# Patient Record
Sex: Male | Born: 1995 | Race: Black or African American | Hispanic: No | Marital: Single | Smoking: Never smoker
Health system: Southern US, Community
[De-identification: ages and names within clinical notes are randomized; demographics above are authoritative.]

## PROBLEM LIST (undated history)

## (undated) DIAGNOSIS — J45909 Unspecified asthma, uncomplicated: Secondary | ICD-10-CM

## (undated) DIAGNOSIS — J302 Other seasonal allergic rhinitis: Secondary | ICD-10-CM

---
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
## 2016-11-30 ENCOUNTER — Encounter (HOSPITAL_COMMUNITY): Payer: Self-pay | Admitting: Emergency Medicine

## 2016-11-30 ENCOUNTER — Emergency Department (HOSPITAL_COMMUNITY)
Admission: EM | Admit: 2016-11-30 | Discharge: 2016-11-30 | Disposition: A | Discharge status: Home / Self Care | Payer: Medicaid Other | Attending: Emergency Medicine | Admitting: Emergency Medicine

## 2016-11-30 DIAGNOSIS — Y999 Unspecified external cause status: Secondary | ICD-10-CM | POA: Insufficient documentation

## 2016-11-30 DIAGNOSIS — Z79899 Other long term (current) drug therapy: Secondary | ICD-10-CM | POA: Insufficient documentation

## 2016-11-30 DIAGNOSIS — W260XXA Contact with knife, initial encounter: Secondary | ICD-10-CM | POA: Insufficient documentation

## 2016-11-30 DIAGNOSIS — Y929 Unspecified place or not applicable: Secondary | ICD-10-CM | POA: Insufficient documentation

## 2016-11-30 DIAGNOSIS — S61412A Laceration without foreign body of left hand, initial encounter: Secondary | ICD-10-CM | POA: Insufficient documentation

## 2016-11-30 DIAGNOSIS — Y939 Activity, unspecified: Secondary | ICD-10-CM | POA: Insufficient documentation

## 2016-11-30 MED ORDER — TETANUS-DIPHTH-ACELL PERTUSSIS 5-2.5-18.5 LF-MCG/0.5 IM SUSP
0.5000 mL | Freq: Once | INTRAMUSCULAR | Status: AC
  Administered 2016-11-30: 0.5 mL via INTRAMUSCULAR
  Filled 2016-11-30: qty 0.5

## 2016-11-30 NOTE — Discharge Instructions (Signed)
Keep laceration clean and dry. Follow up with family doctor as needed. Tissue adhesive will fall off on its own in 3-5 days. Avoid picking at it or soaking hand

## 2016-11-30 NOTE — ED Triage Notes (Signed)
Per EMS pt complaint of pain to left palm related to knife cutting him while watching dishes; event 45 minutes ago.

## 2016-11-30 NOTE — ED Notes (Signed)
Bed: WTR5 Expected date:  Expected time:  Means of arrival:  Comments: 

## 2016-11-30 NOTE — ED Provider Notes (Signed)
[facility]-EMERGENCY DEPT Provider Note   CSN: [id number] Arrival date & time: 11/30/16  0906     History   Chief Complaint Chief Complaint  Patient presents with  . Laceration    HPI Jeffrey Phelps is a 21 y.o. male.  HPI Jeffrey Phelps is a 21 y.o. male presents to emergency department complaining of laceration to the left hand. Patient states he was washing dishes and states the knife slipped and cut his hand. He sustained 2 lacerations to the palm. States unable to stop the bleeding which is what brought him to the emergency department. Denies any numbness or weakness to the fingers distally. Applied pressure to stop the bleeding.   Past Medical History:  Diagnosis Date  . Asthma   . Seasonal allergies     There are no active problems to display for this patient.   History reviewed. No pertinent surgical history.     Home Medications    Prior to Admission medications   Medication Sig Start Date End Date Taking? Authorizing Provider  albuterol (PROVENTIL HFA;VENTOLIN HFA) 108 (90 BASE) MCG/ACT inhaler Inhale 2 puffs into the lungs every 6 (six) hours as needed for wheezing or shortness of breath.    [provider]  albuterol (PROVENTIL) (2.5 MG/3ML) 0.083% nebulizer solution Take 2.5 mg by nebulization every 6 (six) hours as needed for wheezing or shortness of breath.    [provider]  predniSONE (DELTASONE) 20 MG tablet Take 3 tablets (60 mg total) by mouth daily. 04/13/14   Santiago GladLaisure, Heather, PA-C    Family History No family history on file.  Social History Social History  Substance Use Topics  . Smoking status: Never Smoker  . Smokeless tobacco: Not on file  . Alcohol use Not on file     Allergies   Patient has no known allergies.   Review of Systems Review of Systems  Constitutional: Negative for chills and fever.  Skin: Positive for wound.  Neurological: Negative for weakness and numbness.  All other systems reviewed and are  negative.    Physical Exam Updated Vital Signs BP 138/86 (BP Location: Right Arm)   Pulse 62   Temp 98.8 F (37.1 C) (Oral)   Resp 18   SpO2 98%   Physical Exam  Constitutional: He appears well-developed and well-nourished. No distress.  Eyes: Conjunctivae are normal.  Neck: Neck supple.  Cardiovascular: Normal rate.   Pulmonary/Chest: No respiratory distress.  Abdominal: He exhibits no distension.  Musculoskeletal:  Full range of motion of all fingers with flexion and extension at all joints in the left hand. Sensation is intact distally. Capillary refill less than 2 seconds at fingertips.  Skin: Skin is warm and dry.  2 perpendicular lacerations to the palm of the left hand. Appeared to be superficial. Non-gaping.   Nursing note and vitals reviewed.    ED Treatments / Results  Labs (all labs ordered are listed, but only abnormal results are displayed) Labs Reviewed - No data to display  EKG  EKG Interpretation None       Radiology No results found.  Procedures Procedures (including critical care time)  LACERATION REPAIR Performed by: Lottie MusselKIRICHENKO, Jaylin Benzel A Authorized by: Jaynie CrumbleKIRICHENKO, Candice Tobey A Consent: Verbal consent obtained. Risks and benefits: risks, benefits and alternatives were discussed Consent given by: patient Patient identity confirmed: provided demographic data Prepped and Draped in normal sterile fashion Wound explored  Laceration Location: left palm  Laceration Length: 3cm + 3cm  No Foreign Bodies seen or palpated  Anesthesia: None Irrigation method: syringe Amount of cleaning: standard  Skin closure: dermabond   Patient tolerance: Patient tolerated the procedure well with no immediate complications.  Medications Ordered in ED Medications  Tdap (BOOSTRIX) injection 0.5 mL (not administered)     Initial Impression / Assessment and Plan / ED Course  I have reviewed the triage vital signs and the nursing notes.  Pertinent labs  & imaging results that were available during my care of the patient were reviewed by me and considered in my medical decision making (see chart for details).     Patient didn't emergency department with 2 superficial lacerations to the left hand. Cleaned thoroughly with antibacterial soap and water. Bleeding controlled with gauze and pressure. Repaired with Dermabond. I do not think that these lacerations require any stitches. Advised to keep lacerations clean, dry. Follow-up as needed. Tetanus was updated today.  Vitals:   11/30/16 0911  BP: 138/86  Pulse: 62  Resp: 18  Temp: 98.8 F (37.1 C)  TempSrc: Oral  SpO2: 98%     Final Clinical Impressions(s) / ED Diagnoses   Final diagnoses:  Laceration of left hand without foreign body, initial encounter    New Prescriptions New Prescriptions   No medications on file     Jaynie Crumble, PA-C 11/30/16 1009    Mancel Bale, MD 11/30/16 1545

## 2016-12-05 ENCOUNTER — Emergency Department (HOSPITAL_COMMUNITY)
Admission: EM | Admit: 2016-12-05 | Discharge: 2016-12-06 | Disposition: A | Discharge status: Home / Self Care | Payer: Self-pay | Attending: Emergency Medicine | Admitting: Emergency Medicine

## 2016-12-05 ENCOUNTER — Emergency Department (HOSPITAL_COMMUNITY): Payer: Self-pay

## 2016-12-05 ENCOUNTER — Encounter (HOSPITAL_COMMUNITY): Payer: Self-pay

## 2016-12-05 DIAGNOSIS — Z79899 Other long term (current) drug therapy: Secondary | ICD-10-CM | POA: Insufficient documentation

## 2016-12-05 DIAGNOSIS — R112 Nausea with vomiting, unspecified: Secondary | ICD-10-CM

## 2016-12-05 DIAGNOSIS — K2980 Duodenitis without bleeding: Secondary | ICD-10-CM | POA: Insufficient documentation

## 2016-12-05 DIAGNOSIS — J45909 Unspecified asthma, uncomplicated: Secondary | ICD-10-CM | POA: Insufficient documentation

## 2016-12-05 LAB — COMPREHENSIVE METABOLIC PANEL
ALBUMIN: 4.6 g/dL (ref 3.5–5.0)
ALT: 15 U/L — ABNORMAL LOW (ref 17–63)
AST: 21 U/L (ref 15–41)
Alkaline Phosphatase: 46 U/L (ref 38–126)
Anion gap: 9 (ref 5–15)
BUN: 12 mg/dL (ref 6–20)
CO2: 23 mmol/L (ref 22–32)
CREATININE: 1.21 mg/dL (ref 0.61–1.24)
Calcium: 9.4 mg/dL (ref 8.9–10.3)
Chloride: 101 mmol/L (ref 101–111)
GFR calc Af Amer: >60 mL/min (ref >60)
GFR calc non Af Amer: >60 mL/min (ref >60)
Glucose, Bld: 95 mg/dL (ref 65–99)
POTASSIUM: 3.1 mmol/L — AB (ref 3.5–5.1)
Sodium: 133 mmol/L — ABNORMAL LOW (ref 135–145)
Total Bilirubin: 0.9 mg/dL (ref 0.3–1.2)
Total Protein: 7.3 g/dL (ref 6.5–8.1)

## 2016-12-05 LAB — URINALYSIS, ROUTINE W REFLEX MICROSCOPIC
Bacteria, UA: NONE SEEN
Bilirubin Urine: NEGATIVE
Glucose, UA: NEGATIVE mg/dL
Hgb urine dipstick: NEGATIVE
Ketones, ur: 5 mg/dL — AB
Leukocytes, UA: NEGATIVE
Nitrite: NEGATIVE
PROTEIN: 30 mg/dL — AB
Specific Gravity, Urine: 1.033 — ABNORMAL HIGH (ref 1.005–1.030)
Squamous Epithelial / LPF: NONE SEEN
pH: 5 (ref 5.0–8.0)

## 2016-12-05 LAB — CBC
HEMATOCRIT: 43 % (ref 39.0–52.0)
Hemoglobin: 14.9 g/dL (ref 13.0–17.0)
MCH: 29.4 pg (ref 26.0–34.0)
MCHC: 34.7 g/dL (ref 30.0–36.0)
MCV: 84.8 fL (ref 78.0–100.0)
PLATELETS: 283 10*3/uL (ref 150–400)
RBC: 5.07 MIL/uL (ref 4.22–5.81)
RDW: 13.2 % (ref 11.5–15.5)
WBC: 4.6 10*3/uL (ref 4.0–10.5)

## 2016-12-05 LAB — LIPASE, BLOOD: Lipase: 25 U/L (ref 11–51)

## 2016-12-05 MED ORDER — ONDANSETRON HCL 4 MG/2ML IJ SOLN
4.0000 mg | Freq: Once | INTRAMUSCULAR | Status: AC
  Administered 2016-12-05: 4 mg via INTRAVENOUS
  Filled 2016-12-05: qty 2

## 2016-12-05 MED ORDER — POTASSIUM CHLORIDE 10 MEQ/100ML IV SOLN
10.0000 meq | Freq: Once | INTRAVENOUS | Status: AC
  Administered 2016-12-05: 10 meq via INTRAVENOUS
  Filled 2016-12-05: qty 100

## 2016-12-05 MED ORDER — SODIUM CHLORIDE 0.9 % IV BOLUS (SEPSIS)
1000.0000 mL | Freq: Once | INTRAVENOUS | Status: AC
  Administered 2016-12-05: 1000 mL via INTRAVENOUS

## 2016-12-05 MED ORDER — IOPAMIDOL (ISOVUE-300) INJECTION 61%
INTRAVENOUS | Status: AC
  Administered 2016-12-05: 100 mL
  Filled 2016-12-05: qty 100

## 2016-12-05 NOTE — ED Triage Notes (Signed)
Pt from home, brought in by EMS and sent to triage upon arrival. Pt reports RUQ and LLQ abdominal pain associated with nausea onset today around 1930. Denies diarrhea/fever/chills. Has not eaten today and has been working in a hot environment.

## 2016-12-05 NOTE — ED Provider Notes (Signed)
[facility]-EMERGENCY DEPT Provider Note   CSN: [id number] Arrival date & time: 12/05/16  2004     History   Chief Complaint Chief Complaint  Patient presents with  . Abdominal Pain    HPI Jeffrey Phelps is a 21 y.o. male with history of asthma presents to the ED with nausea, vomiting and RUQ and LLQ abdominal pain that started after work today. Emesis 1. No associated fevers, chest pain, shortness of breath, urinary symptoms, diarrhea. Last BM was 4 days ago, this is atypical for patient as he usually has one BM daily. No previous history of constipation. No heavy use of EtOH, NSAIDs. Patient smokes marijuana every weekend, last used 5 days ago. No previous history of abdominal surgeries. 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  HPI  Past Medical History:  Diagnosis Date  . Asthma   . Seasonal allergies     There are no active problems to display for this patient.   History reviewed. No pertinent surgical history.     Home Medications    Prior to Admission medications   Medication Sig Start Date End Date Taking? Authorizing Provider  albuterol (PROVENTIL HFA;VENTOLIN HFA) 108 (90 BASE) MCG/ACT inhaler Inhale 2 puffs into the lungs every 6 (six) hours as needed for wheezing or shortness of breath.   Yes [provider]  albuterol (PROVENTIL) (2.5 MG/3ML) 0.083% nebulizer solution Take 2.5 mg by nebulization every 6 (six) hours as needed for wheezing or shortness of breath.   Yes [provider]    Family History No family history on file.  Social History Social History  Substance Use Topics  . Smoking status: Never Smoker  . Smokeless tobacco: Never Used  . Alcohol use Not on file     Allergies   Patient has no known allergies.   Review of Systems Review of  Systems  Constitutional: Negative for fever.  HENT: Negative for congestion and sore throat.   Eyes: Negative for visual disturbance.  Respiratory: Negative for cough and shortness of breath.   Cardiovascular: Negative for chest pain and palpitations.  Gastrointestinal: Positive for abdominal pain, constipation, nausea and vomiting. Negative for blood in stool and diarrhea.  Genitourinary: Negative for difficulty urinating and dysuria.  Musculoskeletal: Negative for arthralgias and back pain.  Skin: Negative for color change.  Allergic/Immunologic: Negative for immunocompromised state.  Neurological: Negative for headaches.     Physical Exam Updated Vital Signs BP 121/77 (BP Location: Right Arm)   Pulse 61   Temp 98.2 F (36.8 C) (Oral)   Resp 17   SpO2 98%   Physical Exam  Constitutional: He is oriented to person, place, and time. He appears well-developed and well-nourished. No distress.  HENT:  Head: Normocephalic and atraumatic.  Nose: Nose normal.  Mouth/Throat: No oropharyngeal exudate.  Moist mucous membranes  Eyes: Conjunctivae and EOM are normal. Pupils are equal, round, and reactive to light.  Neck: Normal range of motion. Neck supple. No JVD present.  Cardiovascular: Normal rate, regular rhythm, normal heart sounds and intact distal pulses.   No murmur heard. Pulmonary/Chest: Effort normal and breath sounds normal. No respiratory distress. He has no wheezes. He has no rales.  Abdominal: Soft. Bowel sounds are normal. He exhibits no distension and no mass. There is tenderness. There is no guarding.  RUQ and LLQ TTP Negative Murphy's and McBurney's No suprapubic or CVAT  Musculoskeletal: Normal range of motion. He exhibits no deformity.  Lymphadenopathy:    He has no cervical adenopathy.  Neurological: He is alert and oriented to person, place, and time.  Skin: Skin is warm and dry. Capillary refill takes less than 2 seconds.  Psychiatric: He has a normal mood  and affect. His behavior is normal. Judgment and thought content normal.  Nursing note and vitals reviewed.    ED Treatments / Results  Labs (all labs ordered are listed, but only abnormal results are displayed) Labs Reviewed  COMPREHENSIVE METABOLIC PANEL - Abnormal; Notable for the following:       Result Value   Sodium 133 (*)    Potassium 3.1 (*)    ALT 15 (*)    All other components within normal limits  URINALYSIS, ROUTINE W REFLEX MICROSCOPIC - Abnormal; Notable for the following:    Specific Gravity, Urine 1.033 (*)    Ketones, ur 5 (*)    Protein, ur 30 (*)    All other components within normal limits  LIPASE, BLOOD  CBC    EKG  EKG Interpretation None       Radiology Ct Abdomen Pelvis W Contrast  Result Date: 12/05/2016 CLINICAL DATA:  Acute onset of right upper quadrant and left lower quadrant abdominal pain. Nausea. Initial encounter. EXAM: CT ABDOMEN AND PELVIS WITH CONTRAST TECHNIQUE: Multidetector CT imaging of the abdomen and pelvis was performed using the standard protocol following bolus administration of intravenous contrast. CONTRAST:  ISOVUE-300 IOPAMIDOL (ISOVUE-300) INJECTION 61% COMPARISON:  None. FINDINGS: Lower chest: The visualized lung bases are grossly clear. The visualized portions of the mediastinum are unremarkable. Hepatobiliary: A 2.0 cm hepatic cyst is noted adjacent to the falciform ligament. The liver is otherwise unremarkable. The gallbladder is unremarkable. The common bile duct remains normal in caliber. Pancreas: The pancreas is within normal limits. Spleen: The spleen is unremarkable in appearance. Adrenals/Urinary Tract: The adrenal glands are unremarkable in appearance. The kidneys are within normal limits. There is no evidence of hydronephrosis. No renal or ureteral stones are identified. No perinephric stranding is seen. Stomach/Bowel: The stomach is unremarkable in appearance. There is suggestion of mild wall thickening along the  first and second segments of the duodenum, which may reflect mild duodenitis. The small bowel is otherwise unremarkable. The appendix is normal in caliber, without evidence of appendicitis. The colon is unremarkable in appearance. Vascular/Lymphatic: The abdominal aorta is unremarkable in appearance. The inferior vena cava is grossly unremarkable. No retroperitoneal lymphadenopathy is seen. No pelvic sidewall lymphadenopathy is identified. Reproductive: The bladder is mildly distended. Mild bladder wall thickening could reflect cystitis. The prostate remains normal in size. Other: No additional soft tissue abnormalities are seen. Musculoskeletal: No acute osseous abnormalities are identified. The visualized musculature is unremarkable in appearance. IMPRESSION: 1. Mild bladder wall thickening could reflect cystitis. 2. Suggestion of mild wall thickening along the first and second segments of the duodenum, which could reflect mild duodenitis. 3. Apparent 2.0 cm hepatic cyst adjacent to the falciform ligament. Electronically Signed   By: Roanna Raider M.D.   On: 12/05/2016 23:45    Procedures Procedures (including critical care time)  Medications Ordered in ED Medications  potassium chloride 10 mEq in 100 mL IVPB (10 mEq Intravenous New Bag/Given 12/05/16 2354)  potassium chloride 10 mEq in 100 mL IVPB (10 mEq Intravenous New Bag/Given 12/05/16 2354)  sodium chloride 0.9 % bolus 1,000 mL (  0 mLs Intravenous Stopped 12/05/16 2356)  ondansetron (ZOFRAN) injection 4 mg (4 mg Intravenous Given 12/05/16 2226)  iopamidol (ISOVUE-300) 61 % injection (100 mLs  Contrast Given 12/05/16 2310)     Initial Impression / Assessment and Plan / ED Course  I have reviewed the triage vital signs and the nursing notes.  Pertinent labs & imaging results that were available during my care of the patient were reviewed by me and considered in my medical decision making (see chart for details).  Clinical Course as of Dec 07 15  Thu Dec 05, 2016  2208 Potassium: (!) 3.1 [CG]  2356 IMPRESSION: 1. Mild bladder wall thickening could reflect cystitis. 2. Suggestion of mild wall thickening along the first and second segments of the duodenum, which could reflect mild duodenitis. 3. Apparent 2.0 cm hepatic cyst adjacent to the falciform ligament. CT ABDOMEN PELVIS W CONTRAST [CG]    Clinical Course User Index [CG] Liberty HandyGibbons, Kavita Bartl J, PA-C   21 year old male with no significant past medical history presents to the ED with nausea, and NBNB emesis and RUQ and LLQ abdominal pain that started after work today. Abdominal exam reassuring, mild tenderness to RUQ and left LLQ. Lab work is reassuring including lipase and LFTs. No leukocytosis. Urinalysis without evidence of infection.  Hypokalemia K 3.1, repleted. CT scan showed duodenitis of 1st and 2nd segments, otherwise normal scan.    Patient has no personal or family h/o IBD. No heavy use of NSAIDs, ETOH or tobacco use. Has no chronic issues with diarrhea/constipation.  No recent weight loss. Discussed CT scan findings with patient.  I told him it could be reactive inflammation or gastritis vs IBD or some other autoimmune condition.  He is uninsured and does not have a PCP.  Discussed ideally GI f/u but he cannot afford specialist care. I think it's prudent to refer [facility] to [facility] to establish care with a PCP for further work up.  Pt verbalized understanding and agreeable with plan.   Patient n/v and abdominal pain improved in ED. He tolerated fluid challenge prior to discharge.   Final C linical Impressions(s) / ED Diagnoses   Final diagnoses:  Nausea and vomiting in adult patient  Duodenitis    New Prescriptions New Prescriptions   No medications on file     Jerrell MylarGibbons, Ayleah Hofmeister J, PA-C 12/06/16 0122    Charlynne PanderYao, David Hsienta, MD 12/06/16 [phone]

## 2016-12-06 MED ORDER — ONDANSETRON 4 MG PO TBDP: 4.0000 mg | ORAL_TABLET | Freq: Three times a day (TID) | ORAL | 0 refills | Status: DC | PRN

## 2016-12-06 NOTE — ED Notes (Signed)
Pt requested orange juice to drink.

## 2016-12-06 NOTE — Discharge Instructions (Addendum)
Your symptoms improved while in the ED. Your potassium was low today, this was replenished in the ED. Otherwise your lab work was normal. Your CT scan showed inflammation of the first 2 segments of your small intestine. This may be from a viral cause or irritable bowel disease or other causes.  You need to follow up with a gastroenterologist for this.  If you do not have insurance, please [facility] as soon as possible, they can continue the work up necessary for your CT scan findings.   Drink at least 2-3 L of water daily.  Take over the counter stool softener and miralax for constipation.    Return to ED for worsening symptoms

## 2020-02-26 ENCOUNTER — Emergency Department (HOSPITAL_COMMUNITY)
Admission: EM | Admit: 2020-02-26 | Discharge: 2020-02-26 | Disposition: A | Discharge status: Home / Self Care | Payer: Self-pay | Attending: Emergency Medicine | Admitting: Emergency Medicine

## 2020-02-26 ENCOUNTER — Emergency Department (HOSPITAL_COMMUNITY): Payer: Self-pay

## 2020-02-26 DIAGNOSIS — Y999 Unspecified external cause status: Secondary | ICD-10-CM | POA: Insufficient documentation

## 2020-02-26 DIAGNOSIS — S30813A Abrasion of scrotum and testes, initial encounter: Secondary | ICD-10-CM

## 2020-02-26 DIAGNOSIS — Y939 Activity, unspecified: Secondary | ICD-10-CM | POA: Insufficient documentation

## 2020-02-26 DIAGNOSIS — J45909 Unspecified asthma, uncomplicated: Secondary | ICD-10-CM | POA: Insufficient documentation

## 2020-02-26 DIAGNOSIS — Z79899 Other long term (current) drug therapy: Secondary | ICD-10-CM | POA: Insufficient documentation

## 2020-02-26 DIAGNOSIS — Y92410 Unspecified street and highway as the place of occurrence of the external cause: Secondary | ICD-10-CM | POA: Insufficient documentation

## 2020-02-26 DIAGNOSIS — T1490XA Injury, unspecified, initial encounter: Secondary | ICD-10-CM

## 2020-02-26 DIAGNOSIS — M25551 Pain in right hip: Secondary | ICD-10-CM

## 2020-02-26 LAB — BASIC METABOLIC PANEL
Anion gap: 9 (ref 5–15)
BUN: 9 mg/dL (ref 6–20)
CO2: 23 mmol/L (ref 22–32)
Calcium: 8.9 mg/dL (ref 8.9–10.3)
Chloride: 106 mmol/L (ref 98–111)
Creatinine, Ser: 1.3 mg/dL — ABNORMAL HIGH (ref 0.61–1.24)
GFR calc Af Amer: >60 mL/min (ref >60)
GFR calc non Af Amer: >60 mL/min (ref >60)
Glucose, Bld: 85 mg/dL (ref 70–99)
Potassium: 3.7 mmol/L (ref 3.5–5.1)
Sodium: 138 mmol/L (ref 135–145)

## 2020-02-26 LAB — CBC WITH DIFFERENTIAL/PLATELET
Abs Immature Granulocytes: 0.01 10*3/uL (ref 0.00–0.07)
Basophils Absolute: 0 10*3/uL (ref 0.0–0.1)
Basophils Relative: 1 %
Eosinophils Absolute: 0.2 10*3/uL (ref 0.0–0.5)
Eosinophils Relative: 3 %
HCT: 45.8 % (ref 39.0–52.0)
Hemoglobin: 15.1 g/dL (ref 13.0–17.0)
Immature Granulocytes: 0 %
Lymphocytes Relative: 48 %
Lymphs Abs: 2.7 10*3/uL (ref 0.7–4.0)
MCH: 28.5 pg (ref 26.0–34.0)
MCHC: 33 g/dL (ref 30.0–36.0)
MCV: 86.4 fL (ref 80.0–100.0)
Monocytes Absolute: 0.5 10*3/uL (ref 0.1–1.0)
Monocytes Relative: 9 %
Neutro Abs: 2.1 10*3/uL (ref 1.7–7.7)
Neutrophils Relative %: 39 %
Platelets: 309 10*3/uL (ref 150–400)
RBC: 5.3 MIL/uL (ref 4.22–5.81)
RDW: 13.6 % (ref 11.5–15.5)
WBC: 5.5 10*3/uL (ref 4.0–10.5)
nRBC: 0 % (ref 0.0–0.2)

## 2020-02-26 LAB — URINALYSIS, MICROSCOPIC (REFLEX)

## 2020-02-26 LAB — URINALYSIS, ROUTINE W REFLEX MICROSCOPIC
Bilirubin Urine: NEGATIVE
Glucose, UA: NEGATIVE mg/dL
Ketones, ur: NEGATIVE mg/dL
Leukocytes,Ua: NEGATIVE
Nitrite: NEGATIVE
Protein, ur: NEGATIVE mg/dL
Specific Gravity, Urine: 1.02 (ref 1.005–1.030)
pH: 7 (ref 5.0–8.0)

## 2020-02-26 LAB — CBG MONITORING, ED: Glucose-Capillary: 82 mg/dL (ref 70–99)

## 2020-02-26 MED ORDER — IBUPROFEN 600 MG PO TABS: 600.0000 mg | ORAL_TABLET | Freq: Four times a day (QID) | ORAL | 0 refills | Status: DC | PRN

## 2020-02-26 MED ORDER — ACETAMINOPHEN 500 MG PO TABS: 500.0000 mg | ORAL_TABLET | Freq: Four times a day (QID) | ORAL | 0 refills | Status: DC | PRN

## 2020-02-26 NOTE — ED Notes (Signed)
Patient is resting comfortably. 
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
## 2020-02-26 NOTE — Discharge Instructions (Signed)
Your work-up today was reassuring with no evidence of serious head injury and no evidence of pelvic or hip fracture.  Apply the dressing to the wound and change dressings twice daily.  Keep the area clean and dry.  Avoid excessive moisture to the area.  Alternate 600 mg of ibuprofen and [phone] mg of Tylenol every 3 hours as needed for pain. Do not exceed 4000 mg of Tylenol daily.  Take ibuprofen with food to avoid upset stomach issues.  Apply ice or heat, whichever feels best for comfort.  I usually recommend 20 minutes at a time 2-3 times daily.  Take some hot showers and hot baths throughout the day and do some gentle stretching to avoid muscle stiffness.   You may have a concussion, which is head injury without evidence of abnormality on CT scan.  Stay in a dimly lit room with minimal stimuli, reduce screen time (phone, TV) by at least 50% if not more, and avoid contact sports until cleared by a physician.  I have given you the contact information for Dr. Antoine Primas with [facility] who runs a concussion clinic.  You can follow-up with him if your symptoms persist.   I anticipate you will probably feel sore for approximately 1 week.  Do not feel surprised if you wake up tomorrow feeling more sore than you do today. Follow-up with a primary care physician if you are not back to your normal activity levels within 1 week.  Return to the emergency department immediately for any concerning signs or symptoms develop such as altered mental status, persistent vomiting, weakness, severe swelling or redness of a limb, lots of blood in the urine, abnormal drainage from the wound, or fevers.
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
## 2020-02-26 NOTE — ED Notes (Signed)
C-Collar removed per [place], [place] orders
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
## 2022-03-10 ENCOUNTER — Encounter (HOSPITAL_COMMUNITY): Payer: Self-pay | Admitting: *Deleted

## 2022-03-10 ENCOUNTER — Other Ambulatory Visit: Payer: Self-pay

## 2022-03-10 ENCOUNTER — Ambulatory Visit (HOSPITAL_COMMUNITY)
Admission: EM | Admit: 2022-03-10 | Discharge: 2022-03-10 | Disposition: A | Discharge status: Home / Self Care | Payer: Self-pay | Attending: Emergency Medicine | Admitting: Emergency Medicine

## 2022-03-10 DIAGNOSIS — J45901 Unspecified asthma with (acute) exacerbation: Secondary | ICD-10-CM

## 2022-03-10 MED ORDER — ALBUTEROL SULFATE HFA 108 (90 BASE) MCG/ACT IN AERS: 2.0000 | INHALATION_SPRAY | Freq: Four times a day (QID) | RESPIRATORY_TRACT | 3 refills | Status: AC | PRN

## 2022-03-10 MED ORDER — ALBUTEROL SULFATE (2.5 MG/3ML) 0.083% IN NEBU: 2.5000 mg | INHALATION_SOLUTION | Freq: Four times a day (QID) | RESPIRATORY_TRACT | 5 refills | Status: AC | PRN

## 2022-03-10 MED ORDER — IPRATROPIUM-ALBUTEROL 0.5-2.5 (3) MG/3ML IN SOLN
3.0000 mL | Freq: Once | RESPIRATORY_TRACT | Status: AC
  Administered 2022-03-10: 3 mL via RESPIRATORY_TRACT

## 2022-03-10 MED ORDER — IPRATROPIUM-ALBUTEROL 0.5-2.5 (3) MG/3ML IN SOLN
RESPIRATORY_TRACT | Status: AC
  Filled 2022-03-10: qty 3

## 2022-03-10 NOTE — ED Provider Notes (Signed)
[facility]    CSN: [id number] Arrival date & time: 03/10/22  1349      History   Chief Complaint Chief Complaint  Patient presents with   Asthma   Cough    HPI Jeffrey Phelps is a 26 y.o. male.  Presents with shortness of breath for 2 days Reports history of asthma, ran out of his inhaler in March His triggers are seasonal changes  Denies fever, nasal congestion, cough  Past Medical History:  Diagnosis Date   Asthma    Seasonal allergies     There are no problems to display for this patient.   History reviewed. No pertinent surgical history.     Home Medications    Prior to Admission medications   Medication Sig Start Date End Date Taking? Authorizing Provider  albuterol (PROVENTIL) (2.5 MG/3ML) 0.083% nebulizer solution Take 3 mLs (2.5 mg total) by nebulization every 6 (six) hours as needed for wheezing or shortness of breath. 03/10/22   Amyra Vantuyl, Wells Guiles, PA-C  albuterol (VENTOLIN HFA) 108 (90 Base) MCG/ACT inhaler Inhale 2 puffs into the lungs every 6 (six) hours as needed for wheezing or shortness of breath. 03/10/22   Harrel Ferrone, Vernice Jefferson    Family History History reviewed. No pertinent family history.  Social History Social History   Tobacco Use   Smoking status: Never   Smokeless tobacco: Never     Allergies   Patient has no known allergies.   Review of Systems Review of Systems  Respiratory:  Positive for shortness of breath.    Per HPI  Physical Exam Triage Vital Signs ED Triage Vitals  Enc Vitals Group     BP 03/10/22 1354 (!) 153/84     Pulse Rate 03/10/22 1354 91     Resp 03/10/22 1354 20     Temp 03/10/22 1354 98.4 F (36.9 C)     Temp src --      SpO2 03/10/22 1354 99 %     Weight --      Height --      Head Circumference --      Peak Flow --      Pain Score 03/10/22 1356 6     Pain Loc --      Pain Edu? --      Excl. in [place]? --    No data found.  Updated Vital Signs BP (!) 153/84   Pulse 91   Temp  98.4 F (36.9 C)   Resp 20   SpO2 99%    Physical Exam Vitals and nursing note reviewed.  Constitutional:      General: He is not in acute distress.    Appearance: He is not ill-appearing.     Comments: Can speak in full sentences but takes deep breath after each  HENT:     Mouth/Throat:     Mouth: Mucous membranes are moist.     Pharynx: Oropharynx is clear.  Eyes:     Conjunctiva/sclera: Conjunctivae normal.  Cardiovascular:     Rate and Rhythm: Normal rate and regular rhythm.     Heart sounds: Normal heart sounds.  Pulmonary:     Effort: Pulmonary effort is normal.     Breath sounds: Wheezing present.     Comments: Inspiratory and expiratory wheezes throughout Skin:    General: Skin is warm and dry.  Neurological:     Mental Status: He is alert and oriented to person, place, and time.      UC Treatments /  Results  Labs (all labs ordered are listed, but only abnormal results are displayed) Labs Reviewed - No data to display  EKG   Radiology No results found.  Procedures Procedures  Medications Ordered in UC Medications  ipratropium-albuterol (DUONEB) 0.5-2.5 (3) MG/3ML nebulizer solution 3 mL (3 mLs Nebulization Given 03/10/22 1404)    Initial Impression / Assessment and Plan / UC Course  I have reviewed the triage vital signs and the nursing notes.  Pertinent labs & imaging results that were available during my care of the patient were reviewed by me and considered in my medical decision making (see chart for details).  DuoNeb given, patient reports much improvement in symptoms Wheezing decreased throughout lung fields Discussed avoiding triggers, monitoring symptoms  Refilled inhaler and nebulizer solution Set up with PCP via open scheduling, recommend follow-up regarding asthma management Return precautions discussed. Patient agrees to plan  Final Clinical Impressions(s) / UC Diagnoses   Final diagnoses:  Exacerbation of asthma, unspecified  asthma severity, unspecified whether persistent     Discharge Instructions      I refilled your inhaler and nebulizer solution.  Please use these every 6 hours as needed.  I recommend following up with your new primary care provider regarding your asthma management.  Return to the urgent care if symptoms persist.     ED Prescriptions     Medication Sig Dispense Auth. Provider   albuterol (VENTOLIN HFA) 108 (90 Base) MCG/ACT inhaler Inhale 2 puffs into the lungs every 6 (six) hours as needed for wheezing or shortness of breath. 18 g Rae Plotner, PA-C   albuterol (PROVENTIL) (2.5 MG/3ML) 0.083% nebulizer solution Take 3 mLs (2.5 mg total) by nebulization every 6 (six) hours as needed for wheezing or shortness of breath. 75 mL Jeral Zick, Wells Guiles, PA-C      PDMP not reviewed this encounter.   Kolbi Altadonna, Vernice Jefferson 03/10/22 1504

## 2022-03-10 NOTE — ED Triage Notes (Signed)
Pt reports SHOB  and is out of his inhaler . Pt  reports Sx's for 2 Days.

## 2022-03-10 NOTE — Discharge Instructions (Addendum)
I refilled your inhaler and nebulizer solution.  Please use these every 6 hours as needed.  I recommend following up with your new primary care provider regarding your asthma management.  Return to the urgent care if symptoms persist.

## 2022-04-03 ENCOUNTER — Ambulatory Visit: Payer: Self-pay | Admitting: Nurse Practitioner

## 2024-03-02 ENCOUNTER — Encounter (HOSPITAL_COMMUNITY): Payer: Self-pay | Admitting: *Deleted

## 2024-03-02 ENCOUNTER — Ambulatory Visit (HOSPITAL_COMMUNITY)
Admission: EM | Admit: 2024-03-02 | Discharge: 2024-03-02 | Disposition: A | Discharge status: Home / Self Care | Payer: Self-pay | Attending: Emergency Medicine | Admitting: Emergency Medicine

## 2024-03-02 ENCOUNTER — Ambulatory Visit (INDEPENDENT_AMBULATORY_CARE_PROVIDER_SITE_OTHER): Payer: Self-pay

## 2024-03-02 DIAGNOSIS — W102XXA Fall (on)(from) incline, initial encounter: Secondary | ICD-10-CM

## 2024-03-02 DIAGNOSIS — R0602 Shortness of breath: Secondary | ICD-10-CM

## 2024-03-02 NOTE — ED Provider Notes (Signed)
 [facility]    CSN: [id number] Arrival date & time: 03/02/24  1840    HISTORY   Chief Complaint  Patient presents with   Chest Injury   HPI Jeffrey Phelps is a pleasant, 28 y.o. male who presents to urgent care today. Patient states that while he was at work today, he was carrying a 150 pound propane tank while walking up a hill.  Patient states he lost his footing and fell backward down the hill.  Patient states in doing so, the 150 pound propane tank fell square on the center of his chest.  Patient states for about 10 minutes after this occurred, he was unable to take a deep breath.  Patient states this is slowly improving.  Patient states the distance of the fall was about 2 to 3 feet.  The history is provided by the patient.   Past Medical History:  Diagnosis Date   Asthma    Seasonal allergies    There are no active problems to display for this patient.  History reviewed. No pertinent surgical history.  Home Medications    Prior to Admission medications   Medication Sig Start Date End Date Taking? Authorizing Provider  albuterol  (PROVENTIL ) (2.5 MG/3ML) 0.083% nebulizer solution Take 3 mLs (2.5 mg total) by nebulization every 6 (six) hours as needed for wheezing or shortness of breath. 03/10/22   Rising, [place], PA-C  albuterol  (VENTOLIN  HFA) 108 (90 Base) MCG/ACT inhaler Inhale 2 puffs into the lungs every 6 (six) hours as needed for wheezing or shortness of breath. 03/10/22   Rising, Asberry RIGGERS    Family History History reviewed. No pertinent family history. Social History Social History   Tobacco Use   Smoking status: Never   Smokeless tobacco: Never  Vaping Use   Vaping status: Never Used  Substance Use Topics   Alcohol use: Never   Drug use: Yes    Types: Marijuana   Allergies   Patient has no known allergies.  Review of Systems Review of Systems Pertinent findings revealed after performing a 14 point review of systems has been noted in  the history of present illness.  Physical Exam Vital Signs BP (!) 157/108 (BP Location: Right Arm)   Pulse 66   Temp 98.5 F (36.9 C) (Oral)   Resp 18   SpO2 96%   No data found.  Physical Exam Vitals and nursing note reviewed.  Constitutional:      General: He is awake. He is not in acute distress.    Appearance: Normal appearance. He is well-developed and well-groomed. He is not ill-appearing.  Pulmonary:     Effort: Pulmonary effort is normal.     Breath sounds: Normal breath sounds and air entry.  Chest:     Chest wall: No mass, lacerations, deformity, swelling, tenderness, crepitus or edema. There is no dullness to percussion.  Neurological:     Mental Status: He is alert.  Psychiatric:        Behavior: Behavior is cooperative.     Visual Acuity Right Eye Distance:   Left Eye Distance:   Bilateral Distance:    Right Eye Near:   Left Eye Near:    Bilateral Near:     UC Couse / Diagnostics / Procedures:     Radiology DG Chest 2 View Result Date: 03/02/2024 CLINICAL DATA:  Patient fell backward down a hill while holding a 150 pound propane tank, tank landed on his chest and he was unable to breathe at all,  states for about 10 minutes, breathing better now but still unable to complete a sentence without pausing EXAM: CHEST - 2 VIEW COMPARISON:  Radiograph 04/13/2014 FINDINGS: The cardiomediastinal contours are normal. The lungs are clear. Pulmonary vasculature is normal. No consolidation, pleural effusion, or pneumothorax. No evidence of displaced fracture or acute osseous abnormalities are seen. IMPRESSION: Negative radiographs of the chest. Electronically Signed   By: Andrea Gasman M.D.   On: 03/02/2024 20:00    Procedures Procedures (including critical care time) EKG  Pending results:  Labs Reviewed - No data to display  Medications Ordered in UC: Medications - No data to display  UC Diagnoses / Final Clinical Impressions(s)   I have reviewed the  triage vital signs and the nursing notes.  Pertinent labs & imaging results that were available during my care of the patient were reviewed by me and considered in my medical decision making (see chart for details).    Final diagnoses:  SOB (shortness of breath)  Fall (on)(from) incline, initial encounter   Patient has adequate SpO2 on arrival today.  Blood pressure is elevated but I believe this is related to his recent breathlessness.  Patient advised chest x-ray is not concerning for collapsed lung or any acute bony abnormalities.  Patient reassured.  Patient was provided with emergency precautions and encouraged to follow-up as needed.  Please see discharge instructions below for details of plan of care as provided to patient. ED Prescriptions   None    PDMP not reviewed this encounter.  Pending results:  Labs Reviewed - No data to display    Discharge Instructions      The x-ray of your chest does not reveal any sign of lung collapse or acute bony injury.  I am glad that your breathing has improved since the incident occurred.  I believe that this will continue to improve over the next several hours.  If you have not had complete resolution of your symptoms or if your symptoms become worse, please go to the emergency room for further evaluation.  3D imaging may be needed.      Disposition Upon Discharge:  Condition: stable for discharge home  Patient presented with an acute illness with associated systemic symptoms and significant discomfort requiring urgent management. In my opinion, this is a condition that a prudent lay person (someone who possesses an average knowledge of health and medicine) may potentially expect to result in complications if not addressed urgently such as respiratory distress, impairment of bodily function or dysfunction of bodily organs.   Routine symptom specific, illness specific and/or disease specific instructions were discussed with the patient  and/or caregiver at length.   As such, the patient has been evaluated and assessed, work-up was performed and treatment was provided in alignment with urgent care protocols and evidence based medicine.  Patient/parent/caregiver has been advised that the patient may require follow up for further testing and treatment if the symptoms continue in spite of treatment, as clinically indicated and appropriate.  Patient/parent/caregiver has been advised to return to the [facility] or PCP if no better; to PCP or the Emergency Department if new signs and symptoms develop, or if the current signs or symptoms continue to change or worsen for further workup, evaluation and treatment as clinically indicated and appropriate  The patient will follow up with their current PCP if and as advised. If the patient does not currently have a PCP we will assist them in obtaining one.   The patient may need  specialty follow up if the symptoms continue, in spite of conservative treatment and management, for further workup, evaluation, consultation and treatment as clinically indicated and appropriate.  Patient/parent/caregiver verbalized understanding and agreement of plan as discussed.  All questions were addressed during visit.  Please see discharge instructions below for further details of plan.  This office note has been dictated using Teaching laboratory technician.  Unfortunately, this method of dictation can sometimes lead to typographical or grammatical errors.  I apologize for your inconvenience in advance if this occurs.  Please do not hesitate to reach out to me if clarification is needed.      Joesph Shaver Scales, PA-C 03/03/24 1622

## 2024-03-02 NOTE — ED Triage Notes (Addendum)
 Pt states he was carrying a 150 lbs propane tank at work and he fell it landed on his chest about an hour ago. He states his breathing is coming back to normal but he is having mid chest pain. States when he fell backwards it was about 2-3 feet.

## 2024-03-02 NOTE — Discharge Instructions (Signed)
 The x-ray of your chest does not reveal any sign of lung collapse or acute bony injury.  I am glad that your breathing has improved since the incident occurred.  I believe that this will continue to improve over the next several hours.  If you have not had complete resolution of your symptoms or if your symptoms become worse, please go to the emergency room for further evaluation.  3D imaging may be needed.
